# Patient Record
Sex: Male | Born: 1975 | Race: Black or African American | Hispanic: No | Marital: Single | Smoking: Current every day smoker
Health system: Southern US, Community
[De-identification: ages and names within clinical notes are randomized; demographics above are authoritative.]

## PROBLEM LIST (undated history)

## (undated) DIAGNOSIS — L237 Allergic contact dermatitis due to plants, except food: Secondary | ICD-10-CM

## (undated) DIAGNOSIS — I10 Essential (primary) hypertension: Secondary | ICD-10-CM

## (undated) DIAGNOSIS — M1990 Unspecified osteoarthritis, unspecified site: Secondary | ICD-10-CM

## (undated) HISTORY — DX: Unspecified osteoarthritis, unspecified site: M19.90

## (undated) HISTORY — DX: Essential (primary) hypertension: I10

---

## 1997-09-02 ENCOUNTER — Emergency Department (HOSPITAL_COMMUNITY): Admission: EM | Admit: 1997-09-02 | Discharge: 1997-09-02 | Payer: Self-pay | Admitting: Emergency Medicine

## 1997-09-25 ENCOUNTER — Emergency Department (HOSPITAL_COMMUNITY): Admission: EM | Admit: 1997-09-25 | Discharge: 1997-09-25 | Payer: Self-pay | Admitting: Endocrinology

## 1997-11-16 ENCOUNTER — Emergency Department (HOSPITAL_COMMUNITY): Admission: EM | Admit: 1997-11-16 | Discharge: 1997-11-16 | Payer: Self-pay | Admitting: Emergency Medicine

## 1997-11-17 ENCOUNTER — Emergency Department (HOSPITAL_COMMUNITY): Admission: EM | Admit: 1997-11-17 | Discharge: 1997-11-17 | Payer: Self-pay | Admitting: Emergency Medicine

## 1998-03-15 ENCOUNTER — Emergency Department (HOSPITAL_COMMUNITY): Admission: EM | Admit: 1998-03-15 | Discharge: 1998-03-15 | Payer: Self-pay

## 1998-04-28 ENCOUNTER — Emergency Department (HOSPITAL_COMMUNITY): Admission: EM | Admit: 1998-04-28 | Discharge: 1998-04-29 | Payer: Self-pay | Admitting: Emergency Medicine

## 1998-04-30 ENCOUNTER — Emergency Department (HOSPITAL_COMMUNITY): Admission: EM | Admit: 1998-04-30 | Discharge: 1998-04-30 | Payer: Self-pay | Admitting: Emergency Medicine

## 1998-10-07 ENCOUNTER — Emergency Department (HOSPITAL_COMMUNITY): Admission: EM | Admit: 1998-10-07 | Discharge: 1998-10-07 | Payer: Self-pay | Admitting: Emergency Medicine

## 1998-10-28 ENCOUNTER — Emergency Department (HOSPITAL_COMMUNITY): Admission: EM | Admit: 1998-10-28 | Discharge: 1998-10-28 | Payer: Self-pay | Admitting: Emergency Medicine

## 1998-10-28 ENCOUNTER — Encounter: Payer: Self-pay | Admitting: Emergency Medicine

## 2000-09-26 ENCOUNTER — Emergency Department (HOSPITAL_COMMUNITY): Admission: EM | Admit: 2000-09-26 | Discharge: 2000-09-26 | Payer: Self-pay

## 2001-03-19 ENCOUNTER — Emergency Department (HOSPITAL_COMMUNITY): Admission: EM | Admit: 2001-03-19 | Discharge: 2001-03-19 | Payer: Self-pay | Admitting: Emergency Medicine

## 2001-11-10 ENCOUNTER — Emergency Department (HOSPITAL_COMMUNITY): Admission: EM | Admit: 2001-11-10 | Discharge: 2001-11-11 | Payer: Self-pay | Admitting: Emergency Medicine

## 2001-12-30 ENCOUNTER — Emergency Department (HOSPITAL_COMMUNITY): Admission: EM | Admit: 2001-12-30 | Discharge: 2001-12-30 | Payer: Self-pay | Admitting: Emergency Medicine

## 2001-12-30 ENCOUNTER — Encounter: Payer: Self-pay | Admitting: Emergency Medicine

## 2003-08-19 ENCOUNTER — Emergency Department (HOSPITAL_COMMUNITY): Admission: EM | Admit: 2003-08-19 | Discharge: 2003-08-19 | Payer: Self-pay | Admitting: Emergency Medicine

## 2003-09-06 ENCOUNTER — Emergency Department (HOSPITAL_COMMUNITY): Admission: EM | Admit: 2003-09-06 | Discharge: 2003-09-06 | Payer: Self-pay | Admitting: Emergency Medicine

## 2003-09-28 ENCOUNTER — Emergency Department (HOSPITAL_COMMUNITY): Admission: EM | Admit: 2003-09-28 | Discharge: 2003-09-28 | Payer: Self-pay | Admitting: Emergency Medicine

## 2004-09-18 ENCOUNTER — Emergency Department (HOSPITAL_COMMUNITY): Admission: EM | Admit: 2004-09-18 | Discharge: 2004-09-19 | Payer: Self-pay | Admitting: Emergency Medicine

## 2008-05-16 ENCOUNTER — Emergency Department (HOSPITAL_COMMUNITY): Admission: EM | Admit: 2008-05-16 | Discharge: 2008-05-16 | Payer: Self-pay | Admitting: Emergency Medicine

## 2010-01-26 ENCOUNTER — Emergency Department (HOSPITAL_COMMUNITY): Admission: EM | Admit: 2010-01-26 | Discharge: 2010-01-26 | Payer: Self-pay | Admitting: Emergency Medicine

## 2010-06-23 LAB — URINALYSIS, ROUTINE W REFLEX MICROSCOPIC
Bilirubin Urine: NEGATIVE
Glucose, UA: NEGATIVE mg/dL
Hgb urine dipstick: NEGATIVE
Ketones, ur: NEGATIVE mg/dL
Nitrite: NEGATIVE
Protein, ur: NEGATIVE mg/dL
Specific Gravity, Urine: 1.025 (ref 1.005–1.030)
Urobilinogen, UA: 0.2 mg/dL (ref 0.0–1.0)
pH: 6.5 (ref 5.0–8.0)

## 2013-07-16 ENCOUNTER — Encounter (HOSPITAL_BASED_OUTPATIENT_CLINIC_OR_DEPARTMENT_OTHER): Payer: Self-pay | Admitting: Emergency Medicine

## 2013-07-16 ENCOUNTER — Emergency Department (HOSPITAL_BASED_OUTPATIENT_CLINIC_OR_DEPARTMENT_OTHER)
Admission: EM | Admit: 2013-07-16 | Discharge: 2013-07-17 | Disposition: A | Discharge status: Home / Self Care | Payer: Self-pay | Attending: Emergency Medicine | Admitting: Emergency Medicine

## 2013-07-16 DIAGNOSIS — Z87891 Personal history of nicotine dependence: Secondary | ICD-10-CM | POA: Insufficient documentation

## 2013-07-16 DIAGNOSIS — L255 Unspecified contact dermatitis due to plants, except food: Secondary | ICD-10-CM | POA: Insufficient documentation

## 2013-07-16 DIAGNOSIS — L237 Allergic contact dermatitis due to plants, except food: Secondary | ICD-10-CM

## 2013-07-16 HISTORY — DX: Allergic contact dermatitis due to plants, except food: L23.7

## 2013-07-16 MED ORDER — PREDNISONE 50 MG PO TABS
60.0000 mg | ORAL_TABLET | Freq: Once | ORAL | Status: AC
  Administered 2013-07-16: 60 mg via ORAL
  Filled 2013-07-16 (×2): qty 1

## 2013-07-16 MED ORDER — PREDNISONE 20 MG PO TABS: ORAL_TABLET | ORAL | Status: DC

## 2013-07-16 NOTE — ED Notes (Signed)
Did yard work 2 days ago and started getting a "poison ivy" type rash on arms. Spreading to other areas of the body.

## 2013-07-16 NOTE — ED Provider Notes (Signed)
CSN: [id number]     Arrival date & time 07/16/13  2329 History  This chart was scribed for Kevin Mcshan Smitty CordsK Albin Duckett-Rasch, MD by Kevin Sellers, ED Scribe. This patient was seen in room MH05/MH05 and the patient's care was started at 11:45 PM.    Chief Complaint  Patient presents with  . Rash    Patient is a 38 y.o. male presenting with rash. The history is provided by the patient. No language interpreter was used.  Rash Location:  Shoulder/arm and head/neck Head/neck rash location:  Head Shoulder/arm rash location:  L forearm and R forearm Quality: itchiness   Quality: not painful and not peeling   Severity:  Moderate Onset quality:  Gradual Timing:  Constant Progression:  Spreading Chronicity:  New Context: plant contact   Relieved by:  Nothing Worsened by:  Nothing tried Ineffective treatments:  Topical steroids Associated symptoms: no abdominal pain, no periorbital edema, no throat swelling and not wheezing     HPI Comments: Kevin Sellers is a 38 y.o. male who presents to the Emergency Department complaining of rash on arms that began 2 days ago. He believe the rash to be poison ivy. He states it is itchy and painful. Pt states he used calamine lotion and cortisone cream with minimal results.    Past Medical History  Diagnosis Date  . Poison ivy dermatitis     History reviewed. No pertinent past surgical history. No family history on file. History  Substance Use Topics  . Smoking status: Former Games developermoker  . Smokeless tobacco: Not on file  . Alcohol Use: Yes     Comment: occ    Review of Systems  HENT: Negative for facial swelling.   Respiratory: Negative for chest tightness and wheezing.   Cardiovascular: Negative for chest pain.  Gastrointestinal: Negative for abdominal pain.  Skin: Positive for rash.  All other systems reviewed and are negative.     Allergies  Review of patient's allergies indicates no known allergies.   Home Medications    Prior to  Admission medications   Not on File    Triage Vitals: BP 143/83  Pulse 99  Temp(Src) 98.5 F (36.9 C) (Oral)  Resp 18  Ht 6' (1.829 m)  Wt 268 lb (121.564 kg)  BMI 36.34 kg/m2  SpO2 98%   Physical Exam  Nursing note and vitals reviewed. Constitutional: He is oriented to person, place, and time. He appears well-developed and well-nourished. No distress.  HENT:  Head: Normocephalic and atraumatic.  Right Ear: External ear normal.  Left Ear: External ear normal.  No lesions on the palate   Eyes: Conjunctivae are normal. Pupils are equal, round, and reactive to light. Right eye exhibits no discharge. Left eye exhibits no discharge. No scleral icterus.  No swelling of the eyes  Neck: Normal range of motion. Neck supple. No tracheal deviation present.  Cardiovascular: Normal rate, regular rhythm and intact distal pulses.   Pulmonary/Chest: Effort normal and breath sounds normal. No stridor. No respiratory distress. He has no wheezes. He has no rales.  Abdominal: Soft. Bowel sounds are normal. He exhibits no distension. There is no tenderness. There is no rebound and no guarding.  Musculoskeletal: Normal range of motion. He exhibits no edema and no tenderness.  Neurological: He is alert and oriented to person, place, and time. He has normal strength. No cranial nerve deficit (no facial droop, extraocular movements intact, no slurred speech) or sensory deficit. He exhibits normal muscle tone. He displays no seizure  activity. Coordination normal.  Skin: Skin is warm and dry. Rash noted. Rash is vesicular (linear).  Psychiatric: He has a normal mood and affect.    ED Course  Procedures (including critical care time)   DIAGNOSTIC STUDIES: Oxygen Saturation is 98% on RA, normal by my interpretation.     COORDINATION OF CARE: 11:47 PM- Pt advised of plan for treatment and pt agrees.   Labs Review Labs Reviewed - No data to display   Imaging Review No results found.    EKG  Interpretation None      MDM   Final diagnoses:  None    Poison Ivy dermatitis, benadryl for itching and 5 days of steroids.  Bleaching all bed linens and washing clothes in hot soapy water.  Follow up with your family doctor for ongoing care.  Return for facial swelling shortness of breath swelling of the lips or tongue or any concerns  I personally performed the services described in this documentation, which was scribed in my presence. The recorded information has been reviewed and is accurate.    Kevin Margraf Smitty CordsK Starkisha Tullis-Rasch, MD 07/17/13 0200

## 2013-07-16 NOTE — Discharge Instructions (Signed)
Poison Ivy Poison ivy is a inflammation of the skin (contact dermatitis) caused by touching the allergens on the leaves of the ivy plant following previous exposure to the plant. The rash usually appears 48 hours after exposure. The rash is usually bumps (papules) or blisters (vesicles) in a linear pattern. Depending on your own sensitivity, the rash may simply cause redness and itching, or it may also progress to blisters which may break open. These must be well cared for to prevent secondary bacterial (germ) infection, followed by scarring. Keep any open areas dry, clean, dressed, and covered with an antibacterial ointment if needed. The eyes may also get puffy. The puffiness is worst in the morning and gets better as the day progresses. This dermatitis usually heals without scarring, within 2 to 3 weeks without treatment. HOME CARE INSTRUCTIONS  Thoroughly wash with soap and water as soon as you have been exposed to poison ivy. You have about one half hour to remove the plant resin before it will cause the rash. This washing will destroy the oil or antigen on the skin that is causing, or will cause, the rash. Be sure to wash under your fingernails as any plant resin there will continue to spread the rash. Do not rub skin vigorously when washing affected area. Poison ivy cannot spread if no oil from the plant remains on your body. A rash that has progressed to weeping sores will not spread the rash unless you have not washed thoroughly. It is also important to wash any clothes you have been wearing as these may carry active allergens. The rash will return if you wear the unwashed clothing, even several days later. Avoidance of the plant in the future is the best measure. Poison ivy plant can be recognized by the number of leaves. Generally, poison ivy has three leaves with flowering branches on a single stem. Diphenhydramine may be purchased over the counter and used as needed for itching. Do not drive with  this medication if it makes you drowsy.Ask your caregiver about medication for children. SEEK MEDICAL CARE IF:  Open sores develop.  Redness spreads beyond area of rash.  You notice purulent (pus-like) discharge.  You have increased pain.  Other signs of infection develop (such as fever). Document Released: 02/25/2000 Document Revised: 05/22/2011 Document Reviewed: 01/13/2009 [employer] Patient Information 2014 [employer].  

## 2013-07-17 ENCOUNTER — Encounter (HOSPITAL_BASED_OUTPATIENT_CLINIC_OR_DEPARTMENT_OTHER): Payer: Self-pay | Admitting: Emergency Medicine

## 2016-05-16 ENCOUNTER — Emergency Department (HOSPITAL_COMMUNITY): Payer: Self-pay

## 2016-05-16 ENCOUNTER — Encounter (HOSPITAL_COMMUNITY): Payer: Self-pay

## 2016-05-16 ENCOUNTER — Emergency Department (HOSPITAL_COMMUNITY)
Admission: EM | Admit: 2016-05-16 | Discharge: 2016-05-16 | Disposition: A | Discharge status: Home / Self Care | Payer: 59 | Attending: Emergency Medicine | Admitting: Emergency Medicine

## 2016-05-16 DIAGNOSIS — Y92410 Unspecified street and highway as the place of occurrence of the external cause: Secondary | ICD-10-CM | POA: Insufficient documentation

## 2016-05-16 DIAGNOSIS — R0789 Other chest pain: Secondary | ICD-10-CM | POA: Insufficient documentation

## 2016-05-16 DIAGNOSIS — M545 Low back pain, unspecified: Secondary | ICD-10-CM

## 2016-05-16 DIAGNOSIS — Y939 Activity, unspecified: Secondary | ICD-10-CM | POA: Insufficient documentation

## 2016-05-16 DIAGNOSIS — Y999 Unspecified external cause status: Secondary | ICD-10-CM | POA: Insufficient documentation

## 2016-05-16 DIAGNOSIS — Z87891 Personal history of nicotine dependence: Secondary | ICD-10-CM | POA: Insufficient documentation

## 2016-05-16 MED ORDER — ACETAMINOPHEN 325 MG PO TABS
650.0000 mg | ORAL_TABLET | Freq: Once | ORAL | Status: AC
  Administered 2016-05-16: 650 mg via ORAL
  Filled 2016-05-16: qty 2

## 2016-05-16 MED ORDER — NAPROXEN 250 MG PO TABS: 250.0000 mg | ORAL_TABLET | Freq: Two times a day (BID) | ORAL | 0 refills | Status: DC

## 2016-05-16 MED ORDER — METHOCARBAMOL 500 MG PO TABS: 500.0000 mg | ORAL_TABLET | Freq: Two times a day (BID) | ORAL | 0 refills | Status: DC | PRN
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
## 2016-11-21 ENCOUNTER — Telehealth: Payer: Self-pay

## 2016-11-21 NOTE — Telephone Encounter (Signed)
My rule of thumb for new patients is that if they have no other issues to discuss and just want a physical, I am happy to do so. If there is another issue they would like to discuss (eg arm pain), then they can decide if they want to talk about that or the physical. TY.

## 2016-11-21 NOTE — Telephone Encounter (Signed)
Caller name: Rivere,EULA Relation to pt: spouse  Call back number: [phone]   Reason for call:  Spouse scheduled new patient appointment with Dr. Carmelia RollerWendling for Monday 11/27/16. Spouse requesting appointment coded as annual preventive exam, informed patient this would be up to the PCP. Spouse would like to know prior to appointment please advise

## 2016-11-22 ENCOUNTER — Ambulatory Visit: Payer: 59 | Admitting: Family Medicine

## 2016-11-22 NOTE — Telephone Encounter (Signed)
Spouse informed and voice understanding.

## 2016-11-27 ENCOUNTER — Ambulatory Visit: Payer: Managed Care, Other (non HMO) | Admitting: Family Medicine

## 2017-03-22 ENCOUNTER — Other Ambulatory Visit: Payer: Self-pay

## 2017-03-22 ENCOUNTER — Encounter (HOSPITAL_COMMUNITY): Payer: Self-pay | Admitting: Emergency Medicine

## 2017-03-22 ENCOUNTER — Emergency Department (HOSPITAL_COMMUNITY)
Admission: EM | Admit: 2017-03-22 | Discharge: 2017-03-22 | Disposition: A | Discharge status: Home / Self Care | Payer: Managed Care, Other (non HMO) | Attending: Emergency Medicine | Admitting: Emergency Medicine

## 2017-03-22 DIAGNOSIS — R21 Rash and other nonspecific skin eruption: Secondary | ICD-10-CM

## 2017-03-22 DIAGNOSIS — Z87891 Personal history of nicotine dependence: Secondary | ICD-10-CM | POA: Insufficient documentation

## 2017-03-22 DIAGNOSIS — L237 Allergic contact dermatitis due to plants, except food: Secondary | ICD-10-CM | POA: Insufficient documentation

## 2017-03-22 MED ORDER — DIPHENHYDRAMINE HCL 25 MG PO CAPS
25.0000 mg | ORAL_CAPSULE | Freq: Once | ORAL | Status: AC
Start: 2017-03-22 — End: 2017-03-22
  Administered 2017-03-22: 25 mg via ORAL
  Filled 2017-03-22: qty 1

## 2017-03-22 MED ORDER — PREDNISONE 20 MG PO TABS
60.0000 mg | ORAL_TABLET | Freq: Once | ORAL | Status: AC
  Administered 2017-03-22: 60 mg via ORAL
  Filled 2017-03-22: qty 3

## 2017-03-22 MED ORDER — PREDNISONE 20 MG PO TABS: 20.0000 mg | ORAL_TABLET | Freq: Two times a day (BID) | ORAL | 0 refills | Status: AC

## 2017-03-22 NOTE — ED Triage Notes (Signed)
Pt verbalizes rash to face, neck, and scrotal area; friend recently had poison.
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
## 2017-09-23 ENCOUNTER — Other Ambulatory Visit: Payer: Self-pay

## 2017-09-23 ENCOUNTER — Emergency Department (HOSPITAL_COMMUNITY)
Admission: EM | Admit: 2017-09-23 | Discharge: 2017-09-24 | Disposition: A | Discharge status: Home / Self Care | Payer: Self-pay | Attending: Emergency Medicine | Admitting: Emergency Medicine

## 2017-09-23 ENCOUNTER — Encounter (HOSPITAL_COMMUNITY): Payer: Self-pay | Admitting: Emergency Medicine

## 2017-09-23 DIAGNOSIS — Z87891 Personal history of nicotine dependence: Secondary | ICD-10-CM | POA: Insufficient documentation

## 2017-09-23 DIAGNOSIS — Z79899 Other long term (current) drug therapy: Secondary | ICD-10-CM | POA: Insufficient documentation

## 2017-09-23 DIAGNOSIS — R200 Anesthesia of skin: Secondary | ICD-10-CM | POA: Insufficient documentation

## 2017-09-23 MED ORDER — NAPROXEN 500 MG PO TABS
500.0000 mg | ORAL_TABLET | Freq: Once | ORAL | Status: AC
  Administered 2017-09-24: 500 mg via ORAL
  Filled 2017-09-23: qty 1

## 2017-09-23 MED ORDER — CYCLOBENZAPRINE HCL 10 MG PO TABS
5.0000 mg | ORAL_TABLET | Freq: Once | ORAL | Status: AC
  Administered 2017-09-24: 5 mg via ORAL
  Filled 2017-09-23: qty 1

## 2017-09-23 NOTE — ED Triage Notes (Signed)
Pt reports having leg pain and numbness bilaterally for the last 3-4 days. Pt concerned for possible blood clot.

## 2017-09-24 ENCOUNTER — Emergency Department (HOSPITAL_COMMUNITY): Payer: Self-pay

## 2017-09-24 LAB — COMPREHENSIVE METABOLIC PANEL
ALT: 19 U/L (ref 0–44)
ANION GAP: 9 (ref 5–15)
AST: 19 U/L (ref 15–41)
Albumin: 4.4 g/dL (ref 3.5–5.0)
Alkaline Phosphatase: 48 U/L (ref 38–126)
BUN: 10 mg/dL (ref 6–20)
CHLORIDE: 107 mmol/L (ref 98–111)
CO2: 27 mmol/L (ref 22–32)
CREATININE: 0.84 mg/dL (ref 0.61–1.24)
Calcium: 9.2 mg/dL (ref 8.9–10.3)
GFR calc Af Amer: >60 mL/min (ref >60)
GFR calc non Af Amer: >60 mL/min (ref >60)
Glucose, Bld: 93 mg/dL (ref 70–99)
POTASSIUM: 4.2 mmol/L (ref 3.5–5.1)
SODIUM: 143 mmol/L (ref 135–145)
Total Bilirubin: 0.8 mg/dL (ref 0.3–1.2)
Total Protein: 7.4 g/dL (ref 6.5–8.1)

## 2017-09-24 LAB — CBC WITH DIFFERENTIAL/PLATELET
Basophils Absolute: 0.1 10*3/uL (ref 0.0–0.1)
Basophils Relative: 1 %
Eosinophils Absolute: 0.1 10*3/uL (ref 0.0–0.7)
Eosinophils Relative: 1 %
HEMATOCRIT: 48.2 % (ref 39.0–52.0)
HEMOGLOBIN: 16.1 g/dL (ref 13.0–17.0)
LYMPHS ABS: 2.9 10*3/uL (ref 0.7–4.0)
LYMPHS PCT: 32 %
MCH: 29 pg (ref 26.0–34.0)
MCHC: 33.4 g/dL (ref 30.0–36.0)
MCV: 86.7 fL (ref 78.0–100.0)
MONOS PCT: 7 %
Monocytes Absolute: 0.6 10*3/uL (ref 0.1–1.0)
NEUTROS ABS: 5.4 10*3/uL (ref 1.7–7.7)
NEUTROS PCT: 59 %
Platelets: 236 10*3/uL (ref 150–400)
RBC: 5.56 MIL/uL (ref 4.22–5.81)
RDW: 14 % (ref 11.5–15.5)
WBC: 9.1 10*3/uL (ref 4.0–10.5)

## 2017-09-24 LAB — D-DIMER, QUANTITATIVE: D-Dimer, Quant: <0.27 ug/mL-FEU (ref 0.00–0.50)

## 2017-09-24 LAB — TROPONIN I: Troponin I: <0.03 ng/mL (ref <0.03)

## 2017-09-24 LAB — VITAMIN B12: Vitamin B-12: 264 pg/mL (ref 180–914)

## 2017-09-24 LAB — FOLATE: Folate: 12 ng/mL (ref >5.9)

## 2017-09-24 MED ORDER — NAPROXEN 500 MG PO TABS: ORAL_TABLET | ORAL | 0 refills | Status: AC

## 2017-09-24 MED ORDER — CYCLOBENZAPRINE HCL 5 MG PO TABS: 5.0000 mg | ORAL_TABLET | Freq: Three times a day (TID) | ORAL | 0 refills | Status: DC | PRN

## 2017-09-24 NOTE — Discharge Instructions (Addendum)
Take the medications as prescribed. Call to get an appointment with a neurologist to evaluate the numbness in your legs further. Your tests in the ED tonight were normal.

## 2017-09-24 NOTE — ED Provider Notes (Addendum)
[facility]-EMERGENCY DEPT Provider Note   CSN: [id number] Arrival date & time: 09/23/17  1948  Time seen 23:35 PM    History   Chief Complaint Chief Complaint  Patient presents with  . Leg Pain    HPI Kevin Sellers is a 42 y.o. male.  HPI patient states a week ago he started noticing swelling of both lower legs and he states his feet feel cold.  He states both legs are numb from his toes in a stocking glove pattern up to his groin.  He also complains of pain in the bottom of his feet when he tries to walk.  He states when he touches his legs it causes a burning sensation.  He denies any back pain at all.  He does have a history of back problems and saw a chiropractor in 2017 which helped.  At that time he only had pain in his back.  He denies any urinary or rectal incontinence.  He denies any recent travel or being sedentary.  He does state 1 week ago he was sitting on the tailgate of his truck and when he slid off he hurt his left testicle which started with pain in his left thigh.  He denies any pain, swelling, or bruising of his testicle now.  He states he has no difficulty urinating and is having normal erections.  He also states he is having numbness in his left chest for the past week.  He denies any neck pain.  Patient seems very anxious.  His friends have told him he has sciatica and blood clots, poor circulation and several other things and he is concerned.  [facility]  Past Medical History:  Diagnosis Date  . Poison ivy dermatitis     There are no active problems to display for this patient.   History reviewed. No pertinent surgical history.      Home Medications    Valtrex  Prior to Admission medications   Medication Sig Start Date End Date Taking? Authorizing Provider  Ascorbic Acid (VITAMIN C) 100 MG tablet Take 500 mg by mouth daily.   Yes [provider]  diphenhydrAMINE (BENADRYL) 25 mg capsule  Take 25 mg by mouth every 6 (six) hours as needed for itching or allergies.   Yes [provider]  ibuprofen (ADVIL,MOTRIN) 200 MG tablet Take 800 mg by mouth every 6 (six) hours as needed for moderate pain.   Yes [provider]  cyclobenzaprine (FLEXERIL) 5 MG tablet Take 1 tablet (5 mg total) by mouth 3 (three) times daily as needed for muscle spasms. 09/24/17   Devoria AlbeKnapp, Amrie Gurganus, MD  naproxen (NAPROSYN) 500 MG tablet Take 1 po BID with food prn pain 09/24/17   Devoria AlbeKnapp, Simra Fiebig, MD    Family History History reviewed. No pertinent family history.  Social History Social History   Tobacco Use  . Smoking status: Former Games developermoker  . Smokeless tobacco: Never Used  Substance Use Topics  . Alcohol use: Yes    Comment: occ  . Drug use: No  unemployed   Allergies   Patient has no known allergies.   Review of Systems Review of Systems  All other systems reviewed and are negative.    Physical Exam Updated Vital Signs BP (!) 143/95 (BP Location: Right Arm)   Pulse 69   Temp 98.8 F (37.1 C) (Oral)   Resp 19   Ht 5\' 11"  (1.803 m)   Wt 117.9 kg (260 lb)  SpO2 97%   BMI 36.26 kg/m   Vital signs normal    Physical Exam  Constitutional: He appears well-developed and well-nourished. No distress.  HENT:  Head: Normocephalic and atraumatic.  Right Ear: External ear normal.  Left Ear: External ear normal.  Nose: Nose normal.  Mouth/Throat: Oropharynx is clear and moist.  Eyes: Pupils are equal, round, and reactive to light. Conjunctivae and EOM are normal.  Neck: Normal range of motion. Neck supple.  Musculoskeletal: Normal range of motion. He exhibits no edema.  Patient has strong dorsalis pedis pulses bilaterally.  His extremities feel cool to touch however he is in shorts.  His arms also feel cool to touch.  He states "I have a poor circulation problem don't I".  Patient does not have any swelling in his extremities, the veins in his feet are easily discernible.  He has  normal range of motion.       ED Treatments / Results  Labs (all labs ordered are listed, but only abnormal results are displayed) Results for orders placed or performed during the hospital encounter of 09/23/17  Comprehensive metabolic panel  Result Value Ref Range   Sodium 143 135 - 145 mmol/L   Potassium 4.2 3.5 - 5.1 mmol/L   Chloride 107 98 - 111 mmol/L   CO2 27 22 - 32 mmol/L   Glucose, Bld 93 70 - 99 mg/dL   BUN 10 6 - 20 mg/dL   Creatinine, Ser 1.61 0.61 - 1.24 mg/dL   Calcium 9.2 8.9 - 09.6 mg/dL   Total Protein 7.4 6.5 - 8.1 g/dL   Albumin 4.4 3.5 - 5.0 g/dL   AST 19 15 - 41 U/L   ALT 19 0 - 44 U/L   Alkaline Phosphatase 48 38 - 126 U/L   Total Bilirubin 0.8 0.3 - 1.2 mg/dL   GFR calc non Af Amer >60 >60 mL/min   GFR calc Af Amer >60 >60 mL/min   Anion gap 9 5 - 15  CBC with Differential  Result Value Ref Range   WBC 9.1 4.0 - 10.5 K/uL   RBC 5.56 4.22 - 5.81 MIL/uL   Hemoglobin 16.1 13.0 - 17.0 g/dL   HCT 04.5 40.9 - 81.1 %   MCV 86.7 78.0 - 100.0 fL   MCH 29.0 26.0 - 34.0 pg   MCHC 33.4 30.0 - 36.0 g/dL   RDW 91.4 78.2 - 95.6 %   Platelets 236 150 - 400 K/uL   Neutrophils Relative % 59 %   Neutro Abs 5.4 1.7 - 7.7 K/uL   Lymphocytes Relative 32 %   Lymphs Abs 2.9 0.7 - 4.0 K/uL   Monocytes Relative 7 %   Monocytes Absolute 0.6 0.1 - 1.0 K/uL   Eosinophils Relative 1 %   Eosinophils Absolute 0.1 0.0 - 0.7 K/uL   Basophils Relative 1 %   Basophils Absolute 0.1 0.0 - 0.1 K/uL  Vitamin B12  Result Value Ref Range   Vitamin B-12 264 180 - 914 pg/mL  Folate  Result Value Ref Range   Folate 12.0 >5.9 ng/mL  D-dimer, quantitative  Result Value Ref Range   D-Dimer, Quant <0.27 0.00 - 0.50 ug/mL-FEU  Troponin I  Result Value Ref Range   Troponin I <0.03 <0.03 ng/mL   Laboratory interpretation all normal     EKG EKG Interpretation  Date/Time:  Monday September 24 2017 00:27:53 EDT Ventricular Rate:  76 PR Interval:    QRS Duration: 100 QT  Interval:  371  QTC Calculation: 418 R Axis:   17 Text Interpretation:  Sinus rhythm Abnormal R-wave progression, early transition U waves present No old tracing to compare Confirmed by Devoria Albe ([id number]) on 09/24/2017 12:43:58 AM   Radiology Dg Chest 2 View  Result Date: 09/24/2017 CLINICAL DATA:  Chest pain EXAM: CHEST - 2 VIEW COMPARISON:  05/18/2016 chest radiograph FINDINGS: The heart size and mediastinal contours are within normal limits. Both lungs are clear. The visualized skeletal structures are unremarkable. IMPRESSION: No active cardiopulmonary disease. Electronically Signed   By: Deatra Robinson M.D.   On: 09/24/2017 00:25    Procedures Procedures (including critical care time)  Medications Ordered in ED Medications  naproxen (NAPROSYN) tablet 500 mg (500 mg Oral Given 09/24/17 0048)  cyclobenzaprine (FLEXERIL) tablet 5 mg (5 mg Oral Given 09/24/17 0048)     Initial Impression / Assessment and Plan / ED Course  I have reviewed the triage vital signs and the nursing notes.  Pertinent labs & imaging results that were available during my care of the patient were reviewed by me and considered in my medical decision making (see chart for details).    Patient was given anti-inflammatory and muscle relaxer while waiting for his test to result.  Recheck at 2:20 AM patient states he started to feel little bit better.  He was reassured that these tests were normal so far.  I am going to refer him either back to his primary care doctor or to a specialist for further testing for the numbness in his legs.   Final Clinical Impressions(s) / ED Diagnoses   Final diagnoses:  Numbness of both lower extremities    ED Discharge Orders        Ordered    naproxen (NAPROSYN) 500 MG tablet     09/24/17 0224    cyclobenzaprine (FLEXERIL) 5 MG tablet  3 times daily PRN     09/24/17 0224      Plan discharge  Devoria Albe, MD, Concha Pyo, MD 09/24/17 6045    Devoria Albe,  MD 09/24/17 [phone]

## 2018-11-11 ENCOUNTER — Telehealth: Payer: Self-pay

## 2018-11-11 NOTE — Telephone Encounter (Signed)

## 2018-11-12 ENCOUNTER — Encounter: Payer: Self-pay | Admitting: Plastic Surgery

## 2018-11-12 ENCOUNTER — Ambulatory Visit (INDEPENDENT_AMBULATORY_CARE_PROVIDER_SITE_OTHER): Payer: Self-pay | Admitting: Plastic Surgery

## 2018-11-12 ENCOUNTER — Other Ambulatory Visit: Payer: Self-pay

## 2018-11-12 DIAGNOSIS — L905 Scar conditions and fibrosis of skin: Secondary | ICD-10-CM | POA: Insufficient documentation
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
## 2019-01-24 ENCOUNTER — Ambulatory Visit (INDEPENDENT_AMBULATORY_CARE_PROVIDER_SITE_OTHER): Payer: Self-pay | Admitting: Plastic Surgery

## 2019-01-24 ENCOUNTER — Encounter: Payer: Self-pay | Admitting: Plastic Surgery

## 2019-01-24 ENCOUNTER — Other Ambulatory Visit: Payer: Self-pay

## 2019-01-24 DIAGNOSIS — L905 Scar conditions and fibrosis of skin: Secondary | ICD-10-CM
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
## 2019-05-01 ENCOUNTER — Telehealth: Payer: Self-pay | Admitting: Plastic Surgery

## 2019-05-01 NOTE — Telephone Encounter (Signed)

## 2019-05-02 ENCOUNTER — Other Ambulatory Visit: Payer: Self-pay

## 2019-05-02 ENCOUNTER — Ambulatory Visit (INDEPENDENT_AMBULATORY_CARE_PROVIDER_SITE_OTHER): Payer: Self-pay | Admitting: Plastic Surgery

## 2019-05-02 VITALS — BP 138/93 | HR 74 | Temp 98.7°F | Ht 71.0 in | Wt 280.0 lb

## 2019-05-02 DIAGNOSIS — L905 Scar conditions and fibrosis of skin: Secondary | ICD-10-CM

## 2019-05-03 ENCOUNTER — Encounter: Payer: Self-pay | Admitting: Plastic Surgery
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
## 2019-05-19 ENCOUNTER — Ambulatory Visit: Payer: Self-pay

## 2019-06-17 ENCOUNTER — Encounter: Payer: Self-pay | Admitting: Plastic Surgery

## 2019-06-17 ENCOUNTER — Other Ambulatory Visit: Payer: Self-pay

## 2019-06-18 ENCOUNTER — Ambulatory Visit (INDEPENDENT_AMBULATORY_CARE_PROVIDER_SITE_OTHER): Payer: Self-pay

## 2019-06-18 VITALS — BP 140/82 | HR 86 | Temp 98.8°F | Ht 72.0 in | Wt 280.0 lb

## 2019-06-18 DIAGNOSIS — Z719 Counseling, unspecified: Secondary | ICD-10-CM
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
## 2019-07-29 ENCOUNTER — Other Ambulatory Visit: Payer: Self-pay

## 2019-07-29 ENCOUNTER — Ambulatory Visit (INDEPENDENT_AMBULATORY_CARE_PROVIDER_SITE_OTHER): Payer: Self-pay | Admitting: Plastic Surgery

## 2019-07-29 VITALS — BP 132/85 | HR 85 | Temp 97.7°F | Ht 71.0 in | Wt 288.0 lb

## 2019-07-29 DIAGNOSIS — L905 Scar conditions and fibrosis of skin: Secondary | ICD-10-CM
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
## 2019-08-04 ENCOUNTER — Ambulatory Visit: Payer: Self-pay | Admitting: Family Medicine

## 2019-08-04 DIAGNOSIS — Z0289 Encounter for other administrative examinations: Secondary | ICD-10-CM

## 2019-09-16 ENCOUNTER — Ambulatory Visit: Payer: Self-pay | Admitting: Plastic Surgery

## 2019-12-04 IMAGING — CR DG CHEST 2V
2 series · 2 of 2 positions shown · non-contrast
Comparison: 05/18/2016 chest radiograph

CLINICAL DATA: Chest pain

EXAM:
CHEST - 2 VIEW

[w chest pa]
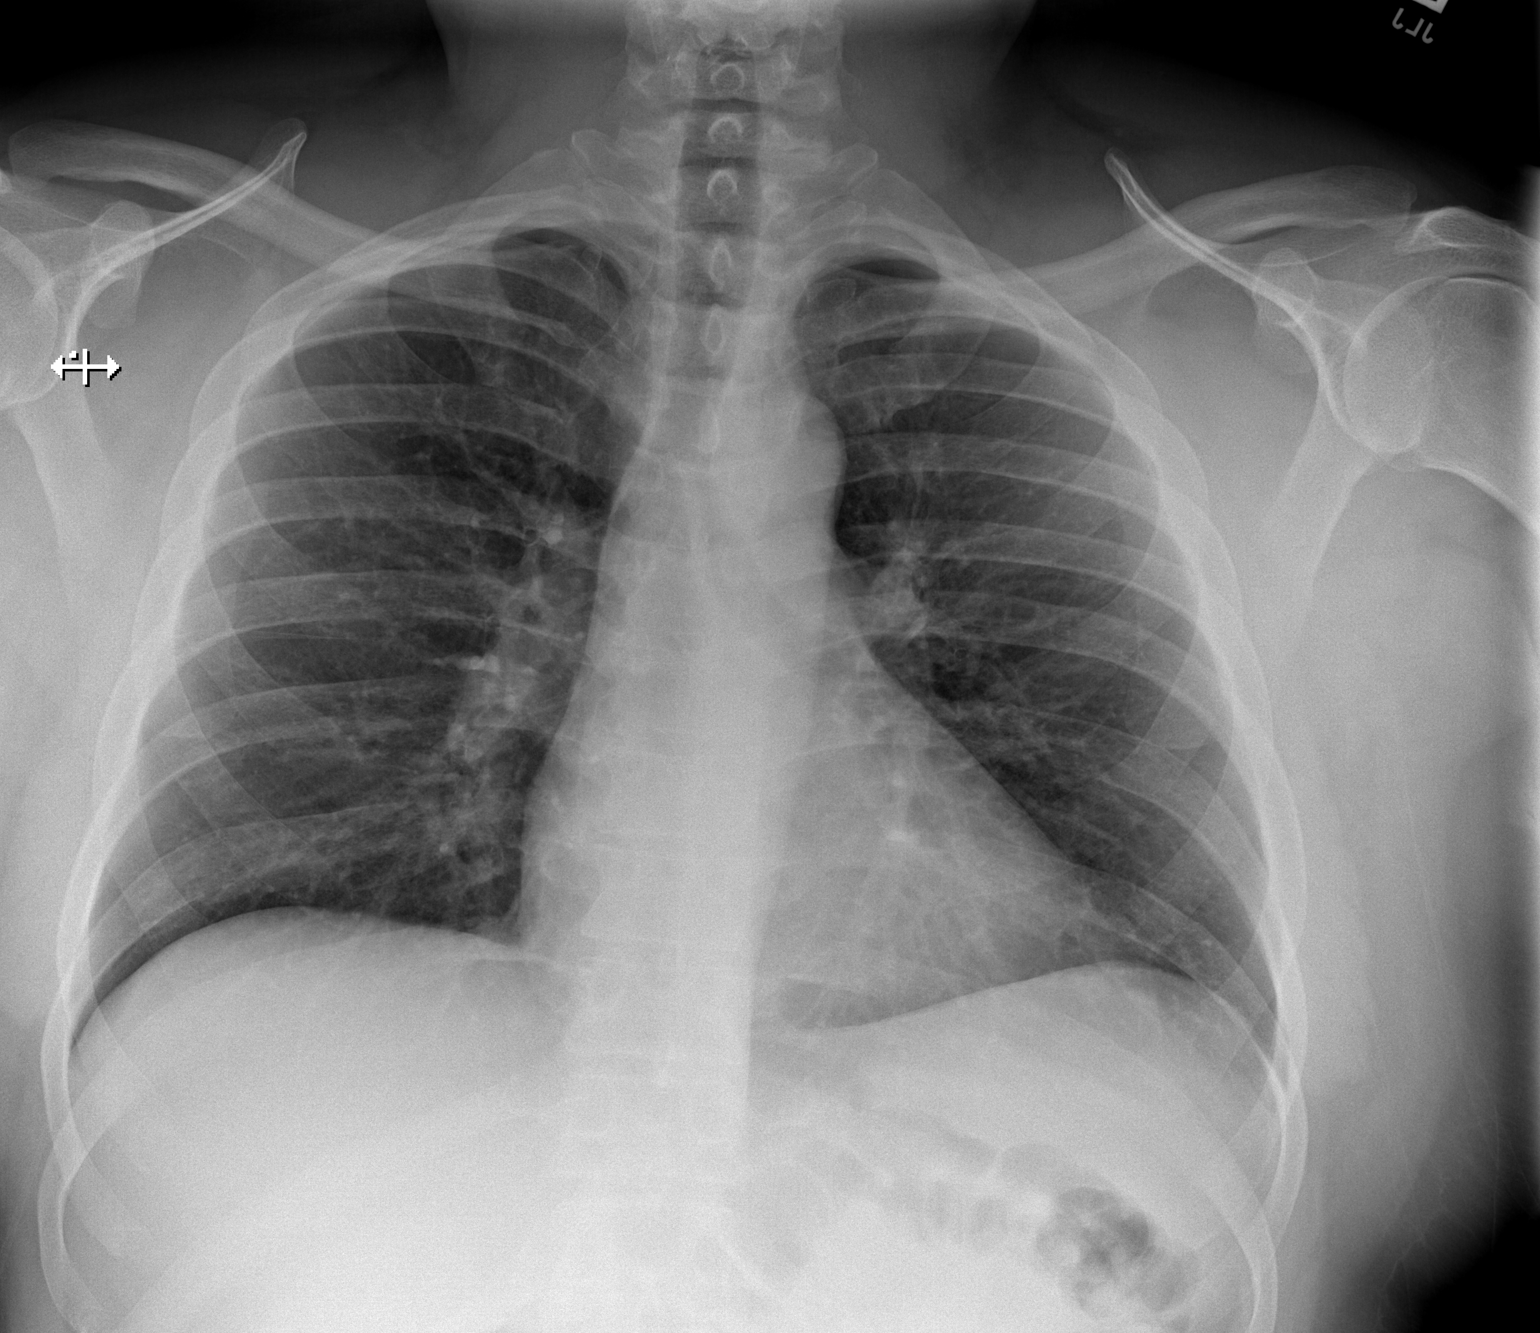

[w chest lat]
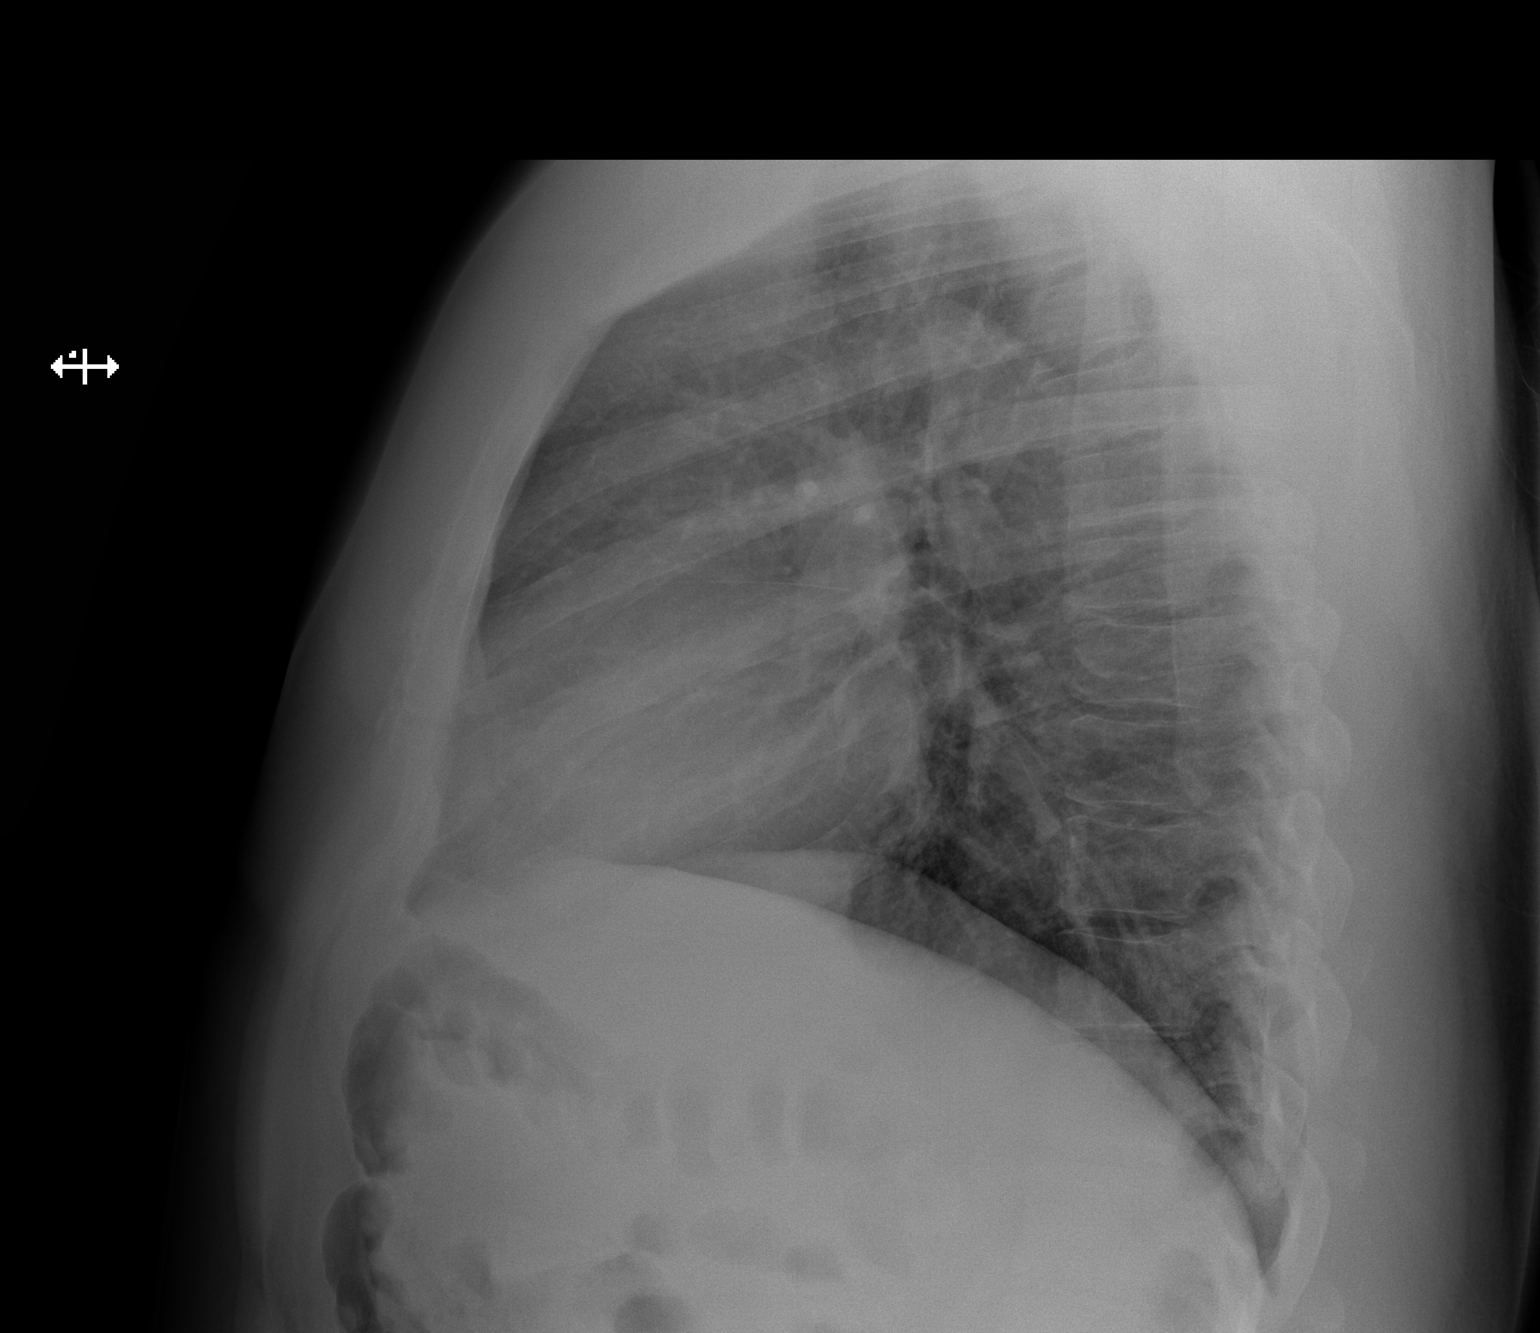

[2 of 2 positions shown; findings below may reference images not displayed]

FINDINGS: The heart size and mediastinal contours are within normal limits.
Both lungs are clear. The visualized skeletal structures are
unremarkable.
IMPRESSION: No active cardiopulmonary disease.

## 2021-07-08 ENCOUNTER — Encounter: Payer: Self-pay | Admitting: Podiatry

## 2021-07-08 ENCOUNTER — Ambulatory Visit (INDEPENDENT_AMBULATORY_CARE_PROVIDER_SITE_OTHER): Payer: 59

## 2021-07-08 ENCOUNTER — Ambulatory Visit: Payer: 59 | Admitting: Podiatry

## 2021-07-08 DIAGNOSIS — M7662 Achilles tendinitis, left leg: Secondary | ICD-10-CM

## 2021-07-08 DIAGNOSIS — M722 Plantar fascial fibromatosis: Secondary | ICD-10-CM | POA: Diagnosis not present

## 2021-07-08 MED ORDER — DICLOFENAC SODIUM 75 MG PO TBEC: 75.0000 mg | DELAYED_RELEASE_TABLET | Freq: Two times a day (BID) | ORAL | 2 refills | Status: AC

## 2021-07-08 MED ORDER — TRIAMCINOLONE ACETONIDE 10 MG/ML IJ SUSP
10.0000 mg | Freq: Once | INTRAMUSCULAR | Status: AC
  Administered 2021-07-08: 10 mg

## 2021-07-08 NOTE — Progress Notes (Signed)
Subjective:  ? ?Patient ID: Kevin Sellers, male   DOB: 46 y.o.   MRN: [id number]  ? ?HPI ?Patient states he has a lot of pain in the back of his left heel and states that this has been going on for years but it is gotten much worse over the last few months.  Patient is going to [employer] in about 4 months and wants to try to get this under good control before that ultimately understands he has a lot of spurs and has had problems since he was young and most likely at 1 point in the future surgery will be necessary.  Patient smokes lightly and tries to be active moderate obesity ? ? ?Review of Systems  ?All other systems reviewed and are negative. ? ? ?   ?Objective:  ?Physical Exam ?Vitals and nursing note reviewed.  ?Constitutional:   ?   Appearance: He is well-developed.  ?Pulmonary:  ?   Effort: Pulmonary effort is normal.  ?Musculoskeletal:     ?   General: Normal range of motion.  ?Skin: ?   General: Skin is warm.  ?Neurological:  ?   Mental Status: He is alert.  ?  ?Neurovascular status found to be intact muscle strength was found to be adequate range of motion within normal limits.  Patient does have exquisite discomfort posterior aspect of the right heel mostly in the medial portion of the insertion to the Achilles tendon slight discomfort central lateral with enlargement around the area consistent with spur formation.  Equinus condition also noted ? ?   ?Assessment:  ?Chronic Achilles tendinitis with acute flareup with large bone spur formation as part of the pathology ? ?   ?Plan:  ?H&P x-rays reviewed condition discussed at great length.  I would like to get him out of this acute inflammation as he really would like to not have to do surgery until after he goes to [place] and I did explain that we can try injection with immobilization and I did explain to him the risk of doing this and the possibilities for rupture that can occur with injection.  He is willing to accept this risk wants to do  this and I carefully did sterile prep and I did inject the medial side 3 mg dexamethasone Kenalog 5 mg Xylocaine I dispensed air fracture walker that I want him to wear full-time for the next 3 weeks I dispensed heel lift that he can slowly start wearing after that and he can gradually return to soft shoe gear after week 3 but no heavy activity.  He will be seen back in the next 6 weeks and we will see the results he may require surgery earlier or hopefully it can be delayed until later in the year ? ?X-rays indicate enlarged spurring of a significant nature posterior aspect of the left heel ?   ? ? ?

## 2021-07-08 NOTE — Patient Instructions (Signed)
Achilles Tendinitis  ?with Rehab ?Achilles tendinitis is a disorder of the Achilles tendon. The Achilles tendon connects the large calf muscles (Gastrocnemius and Soleus) to the heel bone (calcaneus). This tendon is sometimes called the heel cord. It is important for pushing-off and standing on your toes and is important for walking, running, or jumping. Tendinitis is often caused by overuse and repetitive microtrauma. ?SYMPTOMS ?Pain, tenderness, swelling, warmth, and redness may occur over the Achilles tendon even at rest. ?Pain with pushing off, or flexing or extending the ankle. ?Pain that is worsened after or during activity. ?CAUSES  ?Overuse sometimes seen with rapid increase in exercise programs or in sports requiring running and jumping. ?Poor physical conditioning (strength and flexibility or endurance). ?Running sports, especially training running down hills. ?Inadequate warm-up before practice or play or failure to stretch before participation. ?Injury to the tendon. ?PREVENTION  ?Warm up and stretch before practice or competition. ?Allow time for adequate rest and recovery between practices and competition. ?Keep up conditioning. ?Keep up ankle and leg flexibility. ?Improve or keep muscle strength and endurance. ?Improve cardiovascular fitness. ?Use proper technique. ?Use proper equipment (shoes, skates). ?To help prevent recurrence, taping, protective strapping, or an adhesive bandage may be recommended for several weeks after healing is complete. ?PROGNOSIS  ?Recovery may take weeks to several months to heal. ?Longer recovery is expected if symptoms have been prolonged. ?Recovery is usually quicker if the inflammation is due to a direct blow as compared with overuse or sudden strain. ?RELATED COMPLICATIONS  ?Healing time will be prolonged if the condition is not correctly treated. The injury must be given plenty of time to heal. ?Symptoms can reoccur if activity is resumed too soon. ?Untreated,  tendinitis may increase the risk of tendon rupture requiring additional time for recovery and possibly surgery. ?TREATMENT  ?The first treatment consists of rest anti-inflammatory medication, and ice to relieve the pain. ?Stretching and strengthening exercises after resolution of pain will likely help reduce the risk of recurrence. Referral to a physical therapist or athletic trainer for further evaluation and treatment may be helpful. ?A walking boot or cast may be recommended to rest the Achilles tendon. This can help break the cycle of inflammation and microtrauma. ?Arch supports (orthotics) may be prescribed or recommended by your caregiver as an adjunct to therapy and rest. ?Surgery to remove the inflamed tendon lining or degenerated tendon tissue is rarely necessary and has shown less than predictable results. ?MEDICATION  ?Nonsteroidal anti-inflammatory medications, such as aspirin and ibuprofen, may be used for pain and inflammation relief. Do not take within 7 days before surgery. Take these as directed by your caregiver. Contact your caregiver immediately if any bleeding, stomach upset, or signs of allergic reaction occur. Other minor pain relievers, such as acetaminophen, may also be used. ?Pain relievers may be prescribed as necessary by your caregiver. Do not take prescription pain medication for longer than 4 to 7 days. Use only as directed and only as much as you need. ?Cortisone injections are rarely indicated. Cortisone injections may weaken tendons and predispose to rupture. It is better to give the condition more time to heal than to use them. ?HEAT AND COLD ?Cold is used to relieve pain and reduce inflammation for acute and chronic Achilles tendinitis. Cold should be applied for 10 to 15 minutes every 2 to 3 hours for inflammation and pain and immediately after any activity that aggravates your symptoms. Use ice packs or an ice massage. ?Heat may be used before performing stretching   and  strengthening activities prescribed by your caregiver. Use a heat pack or a warm soak. ?SEEK MEDICAL CARE IF: ?Symptoms get worse or do not improve in 2 weeks despite treatment. ?New, unexplained symptoms develop. Drugs used in treatment may produce side effects. ? ?EXERCISES: ? ?RANGE OF MOTION (ROM) AND STRETCHING EXERCISES - Achilles Tendinitis  ?These exercises may help you when beginning to rehabilitate your injury. Your symptoms may resolve with or without further involvement from your physician, physical therapist or athletic trainer. While completing these exercises, remember:  ?Restoring tissue flexibility helps normal motion to return to the joints. This allows healthier, less painful movement and activity. ?An effective stretch should be held for at least 30 seconds. ?A stretch should never be painful. You should only feel a gentle lengthening or release in the stretched tissue. ? ?STRETCH  Gastroc, Standing  ?Place hands on wall. ?Extend right / left leg, keeping the front knee somewhat bent. ?Slightly point your toes inward on your back foot. ?Keeping your right / left heel on the floor and your knee straight, shift your weight toward the wall, not allowing your back to arch. ?You should feel a gentle stretch in the right / left calf. Hold this position for 10 seconds. ?Repeat 3 times. Complete this stretch 2 times per day. ? ?STRETCH  Soleus, Standing  ?Place hands on wall. ?Extend right / left leg, keeping the other knee somewhat bent. ?Slightly point your toes inward on your back foot. ?Keep your right / left heel on the floor, bend your back knee, and slightly shift your weight over the back leg so that you feel a gentle stretch deep in your back calf. ?Hold this position for 10 seconds. ?Repeat 3 times. Complete this stretch 2 times per day. ? ?STRETCH  Gastrocsoleus, Standing  ?Note: This exercise can place a lot of stress on your foot and ankle. Please complete this exercise only if specifically  instructed by your caregiver.  ?Place the ball of your right / left foot on a step, keeping your other foot firmly on the same step. ?Hold on to the wall or a rail for balance. ?Slowly lift your other foot, allowing your body weight to press your heel down over the edge of the step. ?You should feel a stretch in your right / left calf. ?Hold this position for 10 seconds. ?Repeat this exercise with a slight bend in your knee. ?Repeat 3 times. Complete this stretch 2 times per day.  ? ?STRENGTHENING EXERCISES - Achilles Tendinitis ?These exercises may help you when beginning to rehabilitate your injury. They may resolve your symptoms with or without further involvement from your physician, physical therapist or athletic trainer. While completing these exercises, remember:  ?Muscles can gain both the endurance and the strength needed for everyday activities through controlled exercises. ?Complete these exercises as instructed by your physician, physical therapist or athletic trainer. Progress the resistance and repetitions only as guided. ?You may experience muscle soreness or fatigue, but the pain or discomfort you are trying to eliminate should never worsen during these exercises. If this pain does worsen, stop and make certain you are following the directions exactly. If the pain is still present after adjustments, discontinue the exercise until you can discuss the trouble with your clinician. ? ?STRENGTH - Plantar-flexors  ?Sit with your right / left leg extended. Holding onto both ends of a rubber exercise band/tubing, loop it around the ball of your foot. Keep a slight tension in the band. ?Slowly   push your toes away from you, pointing them downward. ?Hold this position for 10 seconds. Return slowly, controlling the tension in the band/tubing. ?Repeat 3 times. Complete this exercise 2 times per day.  ? ?STRENGTH - Plantar-flexors  ?Stand with your feet shoulder width apart. Steady yourself with a wall or table  using as little support as needed. ?Keeping your weight evenly spread over the width of your feet, rise up on your toes.* ?Hold this position for 10 seconds. ?Repeat 3 times. Complete this exercise 2 times per day.  ?

## 2021-08-19 ENCOUNTER — Ambulatory Visit (INDEPENDENT_AMBULATORY_CARE_PROVIDER_SITE_OTHER): Payer: 59 | Admitting: Podiatry

## 2021-08-19 ENCOUNTER — Encounter: Payer: Self-pay | Admitting: Podiatry

## 2021-08-19 DIAGNOSIS — M7662 Achilles tendinitis, left leg: Secondary | ICD-10-CM

## 2021-08-19 NOTE — Progress Notes (Signed)
Subjective:   Patient ID: Kevin Sellers, male   DOB: 46 y.o.   MRN: [id number]   HPI Patient presents stating it is improved but it still quite sore when I do a lot of walking and it got better and then the last week started to hurt again   ROS      Objective:  Physical Exam  Neurovascular status intact inflammation mostly around the lateral side of the posterior heel left with mild to moderate.  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  We will see back September before he goes to [employer] and we may consider 1 more injection and then most likely in October or November will require surgery depending on the response at the time of September.  Continue diclofenac

## 2022-09-01 ENCOUNTER — Emergency Department (HOSPITAL_COMMUNITY)
Admission: EM | Admit: 2022-09-01 | Discharge: 2022-09-01 | Disposition: A | Discharge status: Home / Self Care | Payer: Worker's Compensation | Attending: Emergency Medicine | Admitting: Emergency Medicine

## 2022-09-01 ENCOUNTER — Other Ambulatory Visit: Payer: Self-pay

## 2022-09-01 DIAGNOSIS — Y990 Civilian activity done for income or pay: Secondary | ICD-10-CM | POA: Diagnosis not present

## 2022-09-01 DIAGNOSIS — Z2914 Encounter for prophylactic rabies immune globin: Secondary | ICD-10-CM | POA: Insufficient documentation

## 2022-09-01 DIAGNOSIS — W540XXA Bitten by dog, initial encounter: Secondary | ICD-10-CM | POA: Diagnosis not present

## 2022-09-01 DIAGNOSIS — Z23 Encounter for immunization: Secondary | ICD-10-CM | POA: Diagnosis not present

## 2022-09-01 DIAGNOSIS — Z203 Contact with and (suspected) exposure to rabies: Secondary | ICD-10-CM | POA: Insufficient documentation

## 2022-09-01 DIAGNOSIS — I10 Essential (primary) hypertension: Secondary | ICD-10-CM | POA: Diagnosis not present

## 2022-09-01 DIAGNOSIS — S81831A Puncture wound without foreign body, right lower leg, initial encounter: Secondary | ICD-10-CM | POA: Diagnosis not present

## 2022-09-01 DIAGNOSIS — S8991XA Unspecified injury of right lower leg, initial encounter: Secondary | ICD-10-CM | POA: Diagnosis present

## 2022-09-01 MED ORDER — OXYCODONE-ACETAMINOPHEN 5-325 MG PO TABS: 1.0000 | ORAL_TABLET | Freq: Four times a day (QID) | ORAL | 0 refills | Status: AC | PRN

## 2022-09-01 MED ORDER — RABIES IMMUNE GLOBULIN 150 UNIT/ML IM INJ
20.0000 [IU]/kg | INJECTION | Freq: Once | INTRAMUSCULAR | Status: AC
  Administered 2022-09-01: 2400 [IU] via INTRAMUSCULAR
  Filled 2022-09-01: qty 16

## 2022-09-01 MED ORDER — RABIES VACCINE, PCEC IM SUSR
1.0000 mL | Freq: Once | INTRAMUSCULAR | Status: AC
  Administered 2022-09-01: 1 mL via INTRAMUSCULAR
  Filled 2022-09-01: qty 1

## 2022-09-01 MED ORDER — TETANUS-DIPHTH-ACELL PERTUSSIS 5-2.5-18.5 LF-MCG/0.5 IM SUSY
0.5000 mL | PREFILLED_SYRINGE | Freq: Once | INTRAMUSCULAR | Status: AC
  Administered 2022-09-01: 0.5 mL via INTRAMUSCULAR
  Filled 2022-09-01: qty 0.5

## 2022-09-01 MED ORDER — AMOXICILLIN-POT CLAVULANATE 875-125 MG PO TABS: 1.0000 | ORAL_TABLET | Freq: Two times a day (BID) | ORAL | 0 refills | Status: AC

## 2022-09-01 NOTE — Discharge Instructions (Signed)
Please return to an urgent care on day 4, 7, 14 for repeating rabies vaccines. Take tylenol/ibuprofen every 6 hours or Percocet as needed for pain. I recommend close follow-up with PCP for reevaluation.  Please do not hesitate to return to emergency department if worrisome signs symptoms we discussed become apparent.

## 2022-09-01 NOTE — ED Provider Notes (Signed)
[place] EMERGENCY DEPARTMENT AT [facility] Provider Note   CSN: [id number] Arrival date & time: 09/01/22  1726     History  Chief Complaint  Patient presents with   Animal Bite    Kevin Sellers is a 47 y.o. male past medical of hypertension presents today for evaluation of a dog bite.  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  He is not sure if the dog was fully vaccinated.  He has a puncture wound on the right lower quadrant with hematoma around the puncture wound.  States he has had pain on his right flank and right lower abdomen.  Denies fever, nausea, vomiting, headache, vision changes. Unknown last tetanus shot.   Animal Bite     Past Medical History:  Diagnosis Date   Arthritis    Hypertension    Poison ivy dermatitis    No past surgical history on file.   Home Medications Prior to Admission medications   Medication Sig Start Date End Date Taking? Authorizing Provider  Ascorbic Acid (VITAMIN C) 100 MG tablet Take 500 mg by mouth daily.    [provider]  diclofenac (VOLTAREN) 75 MG EC tablet Take 1 tablet (75 mg total) by mouth 2 (two) times daily. 07/08/21   Lenn Sink, DPM  diphenhydrAMINE (BENADRYL) 25 mg capsule Take 25 mg by mouth every 6 (six) hours as needed for itching or allergies.    [provider]  ibuprofen (ADVIL,MOTRIN) 200 MG tablet Take 800 mg by mouth every 6 (six) hours as needed for moderate pain.    [provider]  naproxen (NAPROSYN) 500 MG tablet Take 1 po BID with food prn pain Patient not taking: Reported on 07/29/2019 09/24/17   Devoria Albe, MD      Allergies    Patient has no known allergies.    Review of Systems   Review of Systems Negative except as per HPI.  Physical Exam Updated Vital Signs BP (!) 154/100   Pulse 72   Temp 97.9 F (36.6 C) (Oral)   Resp 18   Ht 5\' 11"  (1.803 m)   Wt 118.8 kg   SpO2 94%   BMI 36.54  kg/m  Physical Exam Vitals and nursing note reviewed.  Constitutional:      Appearance: Normal appearance.  HENT:     Head: Normocephalic and atraumatic.     Mouth/Throat:     Mouth: Mucous membranes are moist.  Eyes:     General: No scleral icterus. Cardiovascular:     Rate and Rhythm: Normal rate and regular rhythm.     Pulses: Normal pulses.     Heart sounds: Normal heart sounds.  Pulmonary:     Effort: Pulmonary effort is normal.     Breath sounds: Normal breath sounds.  Abdominal:     General: Abdomen is flat.     Palpations: Abdomen is soft.     Tenderness: There is no abdominal tenderness.  Musculoskeletal:        General: No deformity.  Skin:    General: Skin is warm.     Findings: No rash.     Comments: 1 puncture wound noted to the right lower quadrant with surrounding hematoma.  Neurological:     General: No focal deficit present.     Mental Status: He is alert.  Psychiatric:        Mood and Affect: Mood normal.     ED  Results / Procedures / Treatments   Labs (all labs ordered are listed, but only abnormal results are displayed) Labs Reviewed - No data to display  EKG None  Radiology No results found.  Procedures Procedures    Medications Ordered in ED Medications - No data to display  ED Course/ Medical Decision Making/ A&P                             Medical Decision Making  This patient presents to the ED for dog bite, this involves an extensive number of treatment options, and is a complaint that carries with a high risk of complications and morbidity.  The differential diagnosis includes bite wound, cellulitis, intra-abdominal injury.  This is not an exhaustive list.  Problem list/ ED course/ Critical interventions/ Medical management: HPI: See above Vital signs within normal range and stable throughout visit. Laboratory/imaging studies significant for: See above. On physical examination, patient is afebrile and appears in no acute  distress.  There is 1 superficial puncture wound noted to the right lower quadrant with surrounding hematoma.  No active bleeding or pus drainage noted. Patient is well appearing. Given the history of physical examination, I have low suspicion for any intra-abdominal injury.  Sent in Rx for Augmentin for dog bite wound.  Unknown last tetanus shot.  Will update tetanus vaccination.  Will give patient the first dose of rabies vaccine and immunoglobulin.  Advised patient to return to an urgent care on day 4, 7, 14 for repeating rabies vaccination, take Tylenol/ibuprofen or Percocet as needed for pain, follow-up with primary care physician for reevaluation.  Strict return precaution discussed. I have reviewed the patient home medicines and have made adjustments as needed.  Cardiac monitoring/EKG: The patient was maintained on a cardiac monitor.  I personally reviewed and interpreted the cardiac monitor which showed an underlying rhythm of: sinus rhythm.  Additional history obtained: External records from outside source obtained and reviewed including: Chart review including previous notes, labs, imaging.  Consultations obtained:  Disposition Continued outpatient therapy. Follow-up with PCP recommended for reevaluation of symptoms. Treatment plan discussed with patient.  Pt acknowledged understanding was agreeable to the plan. Worrisome signs and symptoms were discussed with patient, and patient acknowledged understanding to return to the ED if they noticed these signs and symptoms. Patient was stable upon discharge.   This chart was dictated using voice recognition software.  Despite best efforts to proofread,  errors can occur which can change the documentation meaning.          Final Clinical Impression(s) / ED Diagnoses Final diagnoses:  Dog bite, initial encounter    Rx / DC Orders ED Discharge Orders          Ordered    amoxicillin-clavulanate (AUGMENTIN) 875-125 MG tablet  2 times  daily        09/01/22 1826    oxyCODONE-acetaminophen (PERCOCET/ROXICET) 5-325 MG tablet  Every 6 hours PRN        09/01/22 1828              Jeanelle Malling, PA 09/01/22 1836    Jacalyn Lefevre, MD 09/01/22 2011

## 2022-09-01 NOTE — ED Triage Notes (Signed)
Pt arrived via POV. Pt was bitten by unidentified dog at 1500 yesterday. Pt has bruise and skin tear on R L abdomen

## 2022-09-05 ENCOUNTER — Ambulatory Visit
Admission: EM | Admit: 2022-09-05 | Discharge: 2022-09-05 | Disposition: A | Discharge status: Home / Self Care | Payer: Worker's Compensation | Attending: Internal Medicine | Admitting: Internal Medicine

## 2022-09-05 DIAGNOSIS — Z203 Contact with and (suspected) exposure to rabies: Secondary | ICD-10-CM

## 2022-09-05 DIAGNOSIS — Z23 Encounter for immunization: Secondary | ICD-10-CM

## 2022-09-05 MED ORDER — RABIES VACCINE, PCEC IM SUSR
1.0000 mL | Freq: Once | INTRAMUSCULAR | Status: AC
  Administered 2022-09-05: 1 mL via INTRAMUSCULAR

## 2022-09-05 NOTE — ED Triage Notes (Signed)
Pt states he is here for his 2nd rabies vaccine. Denies any reaction from first vaccine.

## 2022-09-08 DIAGNOSIS — S0096XA Insect bite (nonvenomous) of unspecified part of head, initial encounter: Secondary | ICD-10-CM | POA: Diagnosis not present

## 2022-09-08 DIAGNOSIS — T7840XA Allergy, unspecified, initial encounter: Secondary | ICD-10-CM | POA: Diagnosis not present

## 2022-09-16 DIAGNOSIS — Z23 Encounter for immunization: Secondary | ICD-10-CM | POA: Diagnosis not present

## 2022-09-16 DIAGNOSIS — Z203 Contact with and (suspected) exposure to rabies: Secondary | ICD-10-CM | POA: Diagnosis present

## 2022-09-17 ENCOUNTER — Other Ambulatory Visit: Payer: Self-pay

## 2022-09-17 ENCOUNTER — Emergency Department (HOSPITAL_COMMUNITY)
Admission: EM | Admit: 2022-09-17 | Discharge: 2022-09-17 | Disposition: A | Discharge status: Home / Self Care | Payer: Worker's Compensation | Attending: Emergency Medicine | Admitting: Emergency Medicine

## 2022-09-17 ENCOUNTER — Encounter (HOSPITAL_COMMUNITY): Payer: Self-pay

## 2022-09-17 DIAGNOSIS — Z203 Contact with and (suspected) exposure to rabies: Secondary | ICD-10-CM

## 2022-09-17 MED ORDER — RABIES VACCINE, PCEC IM SUSR
1.0000 mL | Freq: Once | INTRAMUSCULAR | Status: AC
  Administered 2022-09-17: 1 mL via INTRAMUSCULAR
  Filled 2022-09-17: qty 1

## 2022-09-17 NOTE — ED Triage Notes (Signed)
Pt states that he is here for 3rd and last rabies injection.

## 2022-09-17 NOTE — Discharge Instructions (Signed)
Continue your antibiotics as prescribed.  Follow-up with your primary doctor.  Return to the ED with new or worsening symptoms.

## 2022-09-17 NOTE — ED Provider Notes (Signed)
[place] EMERGENCY DEPARTMENT AT [facility] Provider Note   CSN: [id number] Arrival date & time: 09/16/22  2357     History  Chief Complaint  Patient presents with   Rabies Injection    Ayiden Casillas is a 47 y.o. male.  Patient presents requesting rabies vaccination.  States he was bitten by a dog on June 20.  He was seen in this ED.  He was treated with a course of Augmentin as well as rabies vaccination series.  Did receive rabies vaccination on day 3 and day 7.  This will be his last rabies vaccination.  Denies fevers, chills, nausea or vomiting.  States he completed a course of Augmentin and is now on Cipro for the healing wound.  States that is still firm and painful.  No bleeding or drainage.  The history is provided by the patient.       Home Medications Prior to Admission medications   Medication Sig Start Date End Date Taking? Authorizing Provider  Ascorbic Acid (VITAMIN C) 100 MG tablet Take 500 mg by mouth daily.    [provider]  diclofenac (VOLTAREN) 75 MG EC tablet Take 1 tablet (75 mg total) by mouth 2 (two) times daily. 07/08/21   Lenn Sink, DPM  diphenhydrAMINE (BENADRYL) 25 mg capsule Take 25 mg by mouth every 6 (six) hours as needed for itching or allergies.    [provider]  ibuprofen (ADVIL,MOTRIN) 200 MG tablet Take 800 mg by mouth every 6 (six) hours as needed for moderate pain.    [provider]  naproxen (NAPROSYN) 500 MG tablet Take 1 po BID with food prn pain Patient not taking: Reported on 07/29/2019 09/24/17   Devoria Albe, MD  oxyCODONE-acetaminophen (PERCOCET/ROXICET) 5-325 MG tablet Take 1 tablet by mouth every 6 (six) hours as needed for severe pain. 09/01/22   Jeanelle Malling, PA      Allergies    Patient has no known allergies.    Review of Systems   Review of Systems  Constitutional:  Negative for activity change, appetite change and fever.  HENT:  Negative for congestion.   Respiratory:   Negative for cough, chest tightness and shortness of breath.   Gastrointestinal:  Negative for abdominal pain, nausea and vomiting.  Genitourinary:  Negative for dysuria and hematuria.  Musculoskeletal:  Negative for back pain.  Skin:  Negative for rash.  Neurological:  Negative for dizziness, weakness and headaches.   all other systems are negative except as noted in the HPI and PMH.    Physical Exam Updated Vital Signs BP (!) 137/94   Pulse 85   Temp 97.7 F (36.5 C) (Oral)   Resp 15   SpO2 96%  Physical Exam Vitals and nursing note reviewed.  Constitutional:      General: He is not in acute distress.    Appearance: He is well-developed.  HENT:     Head: Normocephalic and atraumatic.     Mouth/Throat:     Pharynx: No oropharyngeal exudate.  Eyes:     Conjunctiva/sclera: Conjunctivae normal.     Pupils: Pupils are equal, round, and reactive to light.  Neck:     Comments: No meningismus. Cardiovascular:     Rate and Rhythm: Normal rate and regular rhythm.     Heart sounds: Normal heart sounds. No murmur heard. Pulmonary:     Effort: Pulmonary effort is normal. No respiratory distress.     Breath sounds: Normal breath sounds.  Abdominal:  Palpations: Abdomen is soft.     Tenderness: There is no abdominal tenderness. There is no guarding or rebound.     Comments: Healing wound to R abdomen with induration  Musculoskeletal:        General: No tenderness. Normal range of motion.     Cervical back: Normal range of motion and neck supple.  Skin:    General: Skin is warm.  Neurological:     Mental Status: He is alert and oriented to person, place, and time.     Cranial Nerves: No cranial nerve deficit.     Motor: No abnormal muscle tone.     Coordination: Coordination normal.     Comments:  5/5 strength throughout. CN 2-12 intact.Equal grip strength.   Psychiatric:        Behavior: Behavior normal.     ED Results / Procedures / Treatments   Labs (all labs  ordered are listed, but only abnormal results are displayed) Labs Reviewed - No data to display  EKG None  Radiology No results found.  Procedures Procedures    Medications Ordered in ED Medications  rabies vaccine (RABAVERT) injection 1 mL (has no administration in time range)    ED Course/ Medical Decision Making/ A&P                             Medical Decision Making Amount and/or Complexity of Data Reviewed Labs: ordered. Decision-making details documented in ED Course. Radiology: ordered and independent interpretation performed. Decision-making details documented in ED Course. ECG/medicine tests: ordered and independent interpretation performed. Decision-making details documented in ED Course.  Risk Prescription drug management.   Patient requesting rabies vaccination.  Vital stable, no distress.  Wound healing appropriately well on exam.  He is given rabies vaccination per his request.  Continue antibiotics.  Follow-up with PCP.  Return precautions discussed        Final Clinical Impression(s) / ED Diagnoses Final diagnoses:  None    Rx / DC Orders ED Discharge Orders     None         Andreya Lacks, Jeannett Senior, MD 09/17/22 0107

## 2022-11-30 DIAGNOSIS — M5441 Lumbago with sciatica, right side: Secondary | ICD-10-CM | POA: Diagnosis not present

## 2022-11-30 DIAGNOSIS — G8929 Other chronic pain: Secondary | ICD-10-CM | POA: Diagnosis not present

## 2022-11-30 DIAGNOSIS — M5416 Radiculopathy, lumbar region: Secondary | ICD-10-CM | POA: Diagnosis not present
